# Patient Record
Sex: Female | Born: 1980 | Race: White | Hispanic: No | Marital: Married | Smoking: Never smoker
Health system: Southern US, Community
[De-identification: ages and names within clinical notes are randomized; demographics above are authoritative.]

## PROBLEM LIST (undated history)

## (undated) DIAGNOSIS — J45909 Unspecified asthma, uncomplicated: Secondary | ICD-10-CM

---

## 2011-10-28 ENCOUNTER — Emergency Department (HOSPITAL_COMMUNITY): Payer: PRIVATE HEALTH INSURANCE

## 2011-10-28 ENCOUNTER — Encounter (HOSPITAL_COMMUNITY): Payer: Self-pay | Admitting: *Deleted

## 2011-10-28 ENCOUNTER — Emergency Department (HOSPITAL_COMMUNITY)
Admission: EM | Admit: 2011-10-28 | Discharge: 2011-10-28 | Disposition: A | Discharge status: Home / Self Care | Payer: PRIVATE HEALTH INSURANCE | Attending: Emergency Medicine | Admitting: Emergency Medicine

## 2011-10-28 DIAGNOSIS — Z79899 Other long term (current) drug therapy: Secondary | ICD-10-CM | POA: Insufficient documentation

## 2011-10-28 DIAGNOSIS — R109 Unspecified abdominal pain: Secondary | ICD-10-CM | POA: Insufficient documentation

## 2011-10-28 DIAGNOSIS — R1031 Right lower quadrant pain: Secondary | ICD-10-CM | POA: Insufficient documentation

## 2011-10-28 DIAGNOSIS — N200 Calculus of kidney: Secondary | ICD-10-CM

## 2011-10-28 DIAGNOSIS — R112 Nausea with vomiting, unspecified: Secondary | ICD-10-CM | POA: Insufficient documentation

## 2011-10-28 DIAGNOSIS — J45909 Unspecified asthma, uncomplicated: Secondary | ICD-10-CM | POA: Insufficient documentation

## 2011-10-28 HISTORY — DX: Unspecified asthma, uncomplicated: J45.909

## 2011-10-28 LAB — URINALYSIS, ROUTINE W REFLEX MICROSCOPIC
Glucose, UA: NEGATIVE mg/dL
Leukocytes, UA: NEGATIVE
Nitrite: NEGATIVE
Urobilinogen, UA: 0.2 mg/dL (ref 0.0–1.0)

## 2011-10-28 LAB — PREGNANCY, URINE: Preg Test, Ur: NEGATIVE

## 2011-10-28 LAB — URINE MICROSCOPIC-ADD ON

## 2011-10-28 MED ORDER — ONDANSETRON HCL 4 MG/2ML IJ SOLN
4.0000 mg | Freq: Once | INTRAMUSCULAR | Status: AC
  Administered 2011-10-28: 4 mg via INTRAVENOUS
  Filled 2011-10-28: qty 2

## 2011-10-28 MED ORDER — OXYCODONE-ACETAMINOPHEN 5-325 MG PO TABS
1.0000 | ORAL_TABLET | Freq: Once | ORAL | Status: AC
  Administered 2011-10-28: 23:00:00 via ORAL

## 2011-10-28 MED ORDER — OXYCODONE-ACETAMINOPHEN 5-325 MG PO TABS
ORAL_TABLET | ORAL | Status: AC
  Filled 2011-10-28: qty 1

## 2011-10-28 MED ORDER — SODIUM CHLORIDE 0.9 % IV SOLN
INTRAVENOUS | Status: DC
  Administered 2011-10-28: 20:00:00 via INTRAVENOUS

## 2011-10-28 MED ORDER — OXYCODONE-ACETAMINOPHEN 5-325 MG PO TABS: 1.0000 | ORAL_TABLET | Freq: Four times a day (QID) | ORAL | Status: AC | PRN

## 2011-10-28 MED ORDER — ONDANSETRON HCL 8 MG PO TABS: 8.0000 mg | ORAL_TABLET | ORAL | Status: AC | PRN

## 2011-10-28 MED ORDER — KETOROLAC TROMETHAMINE 30 MG/ML IJ SOLN
30.0000 mg | Freq: Once | INTRAMUSCULAR | Status: AC
  Administered 2011-10-28: 30 mg via INTRAVENOUS
  Filled 2011-10-28: qty 1

## 2011-10-28 MED ORDER — SODIUM CHLORIDE 0.9 % IV BOLUS (SEPSIS)
500.0000 mL | Freq: Once | INTRAVENOUS | Status: AC
  Administered 2011-10-28: 20:00:00 via INTRAVENOUS

## 2011-10-28 MED ORDER — TAMSULOSIN HCL 0.4 MG PO CAPS: 0.4000 mg | ORAL_CAPSULE | Freq: Every day | ORAL | Status: AC

## 2011-10-28 MED ORDER — KETOROLAC TROMETHAMINE 60 MG/2ML IM SOLN: 60.0000 mg | Freq: Once | INTRAMUSCULAR | Status: DC

## 2011-10-28 MED ORDER — HYDROMORPHONE HCL PF 1 MG/ML IJ SOLN
1.0000 mg | Freq: Once | INTRAMUSCULAR | Status: AC
  Administered 2011-10-28: 1 mg via INTRAVENOUS
  Filled 2011-10-28: qty 1

## 2011-10-28 NOTE — ED Provider Notes (Signed)
History     CSN: [id number]  Arrival date & time 10/28/11  1919   First MD Initiated Contact with Patient 10/28/11 1952      Chief Complaint  Patient presents with  . Flank Pain    (Consider location/radiation/quality/duration/timing/severity/associated sxs/prior treatment) HPI.........abrupt onset right flank pain several hours ago. No dysuria, hematuria, fever, chills.  Nothing makes pain better or worse. Radiation to right lower quadrant. It is severe and crampy in nature  Past Medical History  Diagnosis Date  . Asthma     History reviewed. No pertinent past surgical history.  History reviewed. No pertinent family history.  History  Substance Use Topics  . Smoking status: Never Smoker   . Smokeless tobacco: Not on file  . Alcohol Use: No    OB History    Grav Para Term Preterm Abortions TAB SAB Ect Mult Living                  Review of Systems  All other systems reviewed and are negative.    Allergies  Review of patient's allergies indicates no known allergies.  Home Medications   Current Outpatient Rx  Name Route Sig Dispense Refill  . ALBUTEROL SULFATE HFA 108 (90 BASE) MCG/ACT IN AERS Inhalation Inhale 2 puffs into the lungs every 6 (six) hours as needed.    Marland Kitchen FLUTICASONE-SALMETEROL 100-50 MCG/DOSE IN AEPB Inhalation Inhale 1 puff into the lungs every 12 (twelve) hours.    . ONDANSETRON HCL 8 MG PO TABS Oral Take 1 tablet (8 mg total) by mouth every 4 (four) hours as needed for nausea. 10 tablet 0  . OXYCODONE-ACETAMINOPHEN 5-325 MG PO TABS Oral Take 1-2 tablets by mouth every 6 (six) hours as needed for pain. 20 tablet 0  . TAMSULOSIN HCL 0.4 MG PO CAPS Oral Take 1 capsule (0.4 mg total) by mouth daily. 15 capsule 0    BP 146/95  Pulse 84  Temp 98.3 F (36.8 C) (Oral)  Resp 24  Ht 5' 9.5" (1.765 m)  Wt 235 lb (106.595 kg)  BMI 34.21 kg/m2  SpO2 99%  LMP 10/14/2011  Physical Exam  Nursing note and vitals reviewed. Constitutional: She is  oriented to person, place, and time. She appears well-developed and well-nourished.  HENT:  Head: Normocephalic and atraumatic.  Eyes: Conjunctivae and EOM are normal. Pupils are equal, round, and reactive to light.  Neck: Normal range of motion. Neck supple.  Cardiovascular: Normal rate and regular rhythm.   Pulmonary/Chest: Effort normal and breath sounds normal.  Abdominal: Soft. Bowel sounds are normal.  Genitourinary:       Right flank tenderness  Musculoskeletal: Normal range of motion.  Neurological: She is alert and oriented to person, place, and time.  Skin: Skin is warm and dry.  Psychiatric: She has a normal mood and affect.    ED Course  Procedures (including critical care time)  Labs Reviewed  URINALYSIS, ROUTINE W REFLEX MICROSCOPIC - Abnormal; Notable for the following:    Hgb urine dipstick LARGE (*)     All other components within normal limits  URINE MICROSCOPIC-ADD ON - Abnormal; Notable for the following:    Squamous Epithelial / LPF MANY (*)     Bacteria, UA FEW (*)     All other components within normal limits  PREGNANCY, URINE   Ct Abdomen Pelvis Wo Contrast  10/28/2011  *RADIOLOGY REPORT*  Clinical Data: Right flank pain.  Nausea and vomiting.  Hematuria.  CT ABDOMEN AND PELVIS WITHOUT  CONTRAST  Technique:  Multidetector CT imaging of the abdomen and pelvis was performed following the standard protocol without intravenous contrast.  Comparison: None.  Findings: The lung bases are clear.  There is a 4 mm stone in the distal right ureter at the ureterovesicle junction with proximal pyelocaliectasis and ureterectasis as well as periureteral and pararenal stranding. Changes are consistent with moderate degree of obstruction.  The left ureter and collecting system are decompressed and no left ureteral or bladder stones are visualized.  The unenhanced appearance of the liver, spleen, gallbladder, pancreas, adrenal glands, abdominal aorta, and retroperitoneal lymph  nodes is unremarkable.  The stomach, small bowel, and colon are decompressed.  Diffusely stool filled colon.  No free air or free fluid in the abdomen.  Pelvis:  The uterus and adnexal structures are not enlarged.  No bladder wall thickening.  No free or loculated pelvic fluid collections.  No significant pelvic lymphadenopathy.  No evidence of diverticulitis.  The appendix is normal.  Normal alignment of the lumbar vertebrae.  IMPRESSION: 4 mm stone in the distal right ureter with moderate proximal obstruction.  Original Report Authenticated By: Marlon Pel, M.D.     1. Right kidney stone       MDM  History and physical consistent with right ureterolithiasis.   Better after pain management. Diagnosis discussed with patient and husband. Discharged on Percocet, Zofran, Flomax        Donnetta Hutching, MD 10/28/11 [phone]

## 2011-10-28 NOTE — ED Notes (Signed)
Rt flank pain with n/v

## 2014-02-17 IMAGING — CT CT ABD-PELV W/O CM
2 of 4 series · 17 of 46 positions shown, 19 images · non-contrast
Comparison: None.

CLINICAL DATA: Right flank pain.  Nausea and vomiting.  Hematuria.

CT ABDOMEN AND PELVIS WITHOUT CONTRAST
TECHNIQUE: Multidetector CT imaging of the abdomen and pelvis was
performed following the standard protocol without intravenous
contrast.

[Series 2: standard/full over (age)lbs 5.0 · axial · 0.90mm/px · z∈[-532,-87]mm · 14 of 97 slices shown, 16 images]
[im 4/97  soft-tissue]
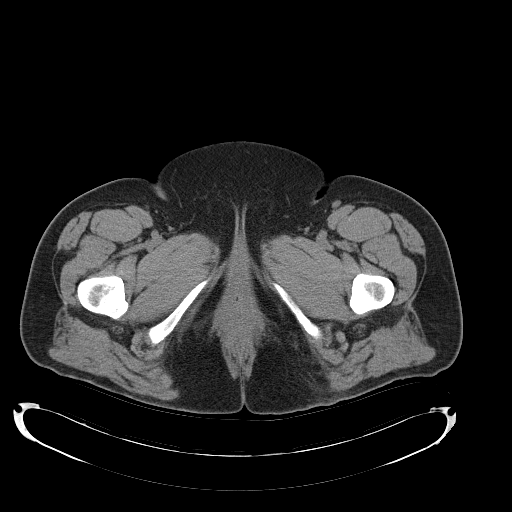
[im 4/97  bone]
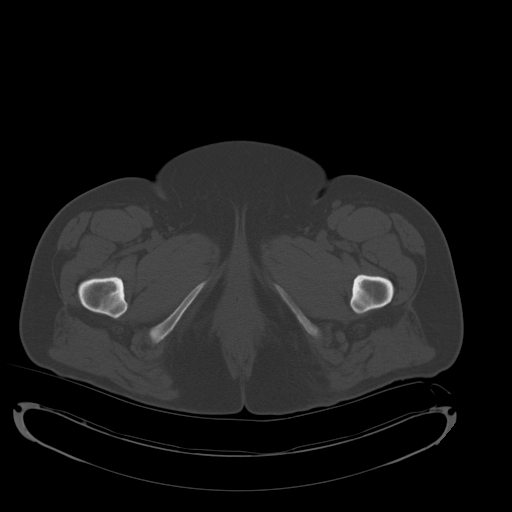
[im 12/97  soft-tissue]
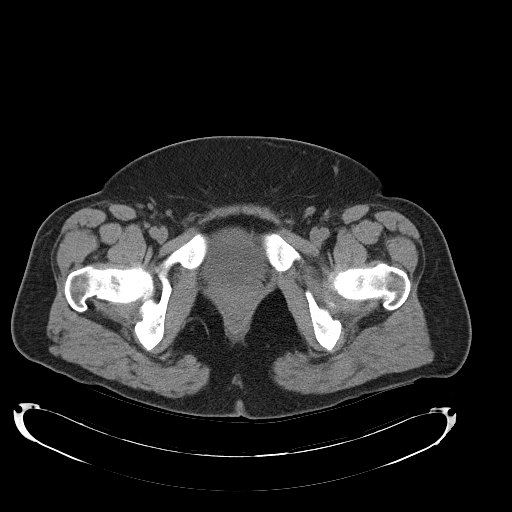
[im 19/97  soft-tissue]
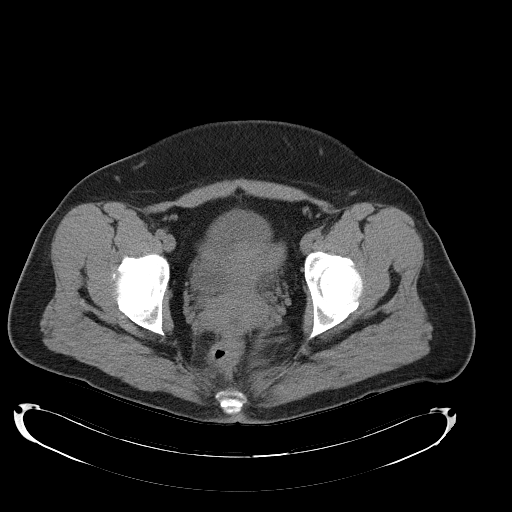
[im 26/97  soft-tissue]
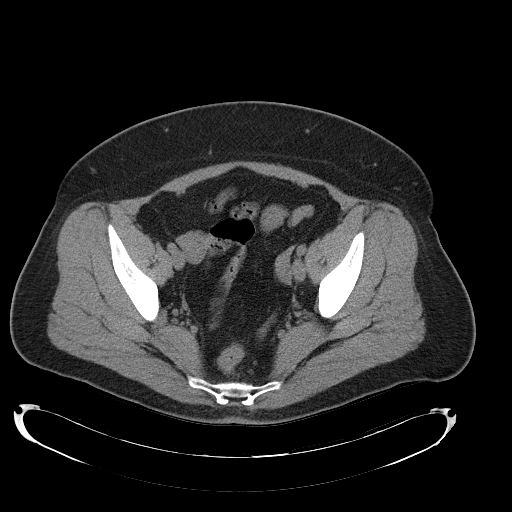
[im 34/97  soft-tissue]
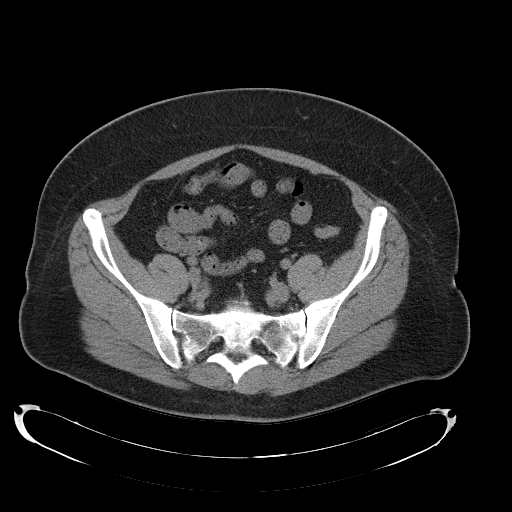
[im 37/97  soft-tissue]
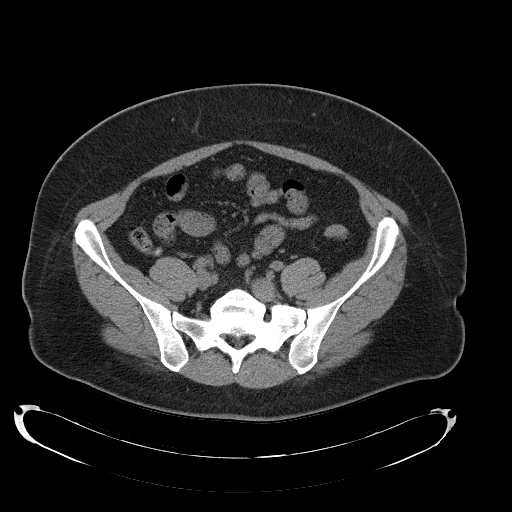
[im 45/97  soft-tissue]
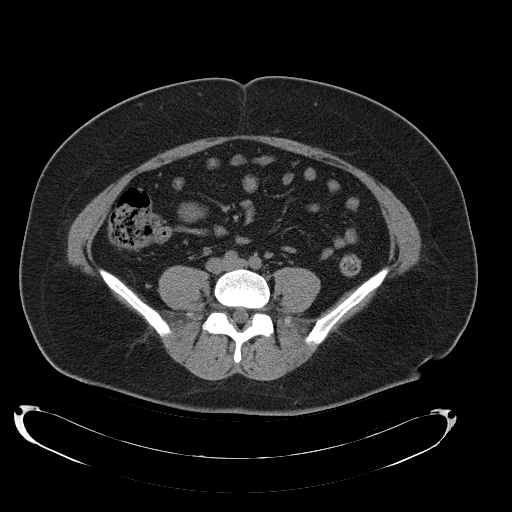
[im 52/97  soft-tissue]
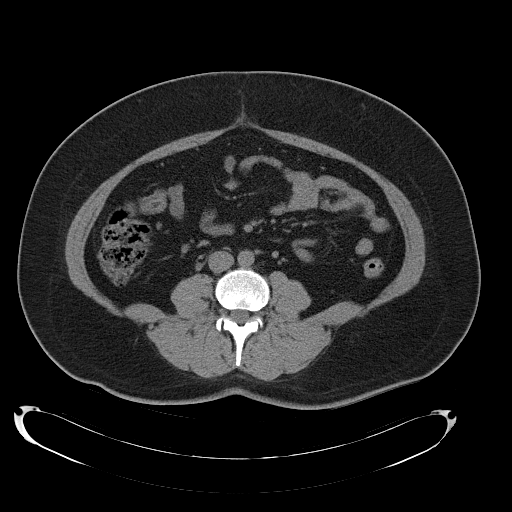
[im 60/97  soft-tissue]
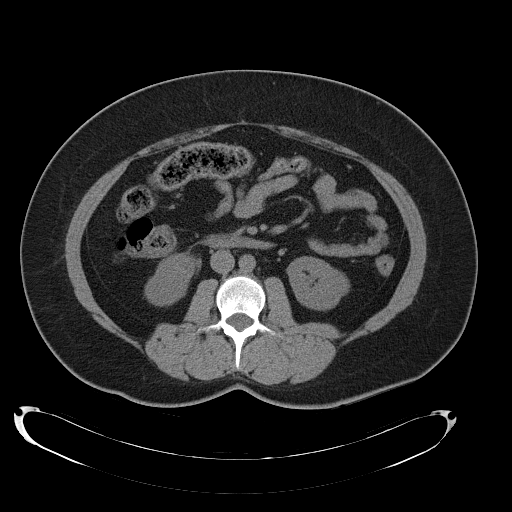
[im 60/97  bone]
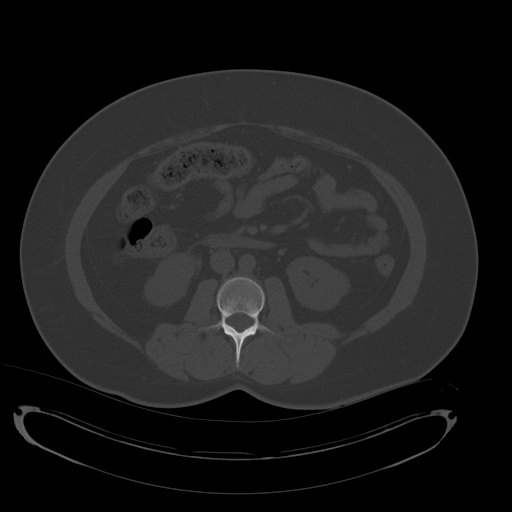
[im 63/97  soft-tissue]
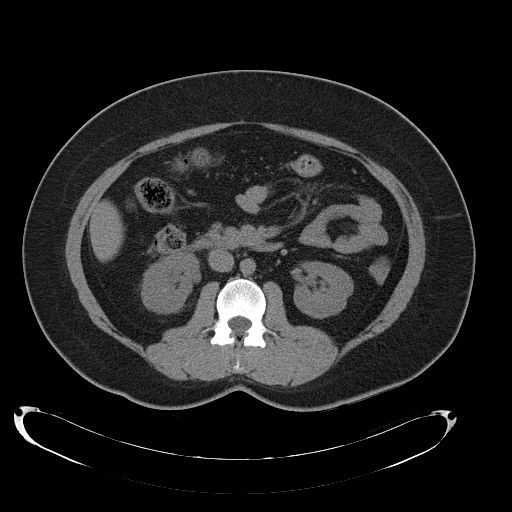
[im 71/97  soft-tissue]
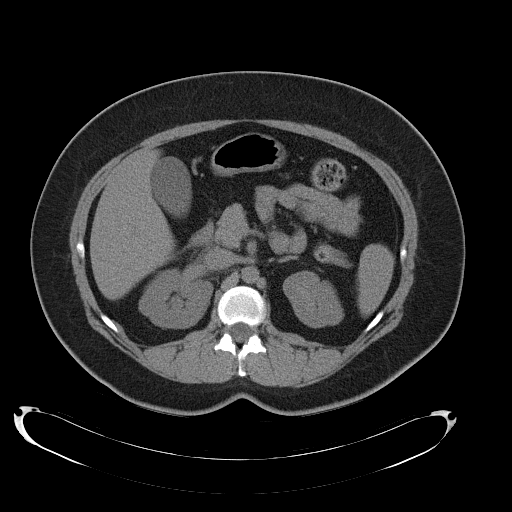
[im 78/97  soft-tissue]
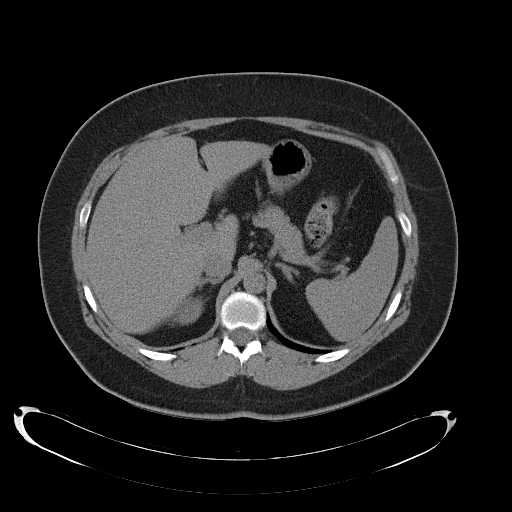
[im 85/97  soft-tissue]
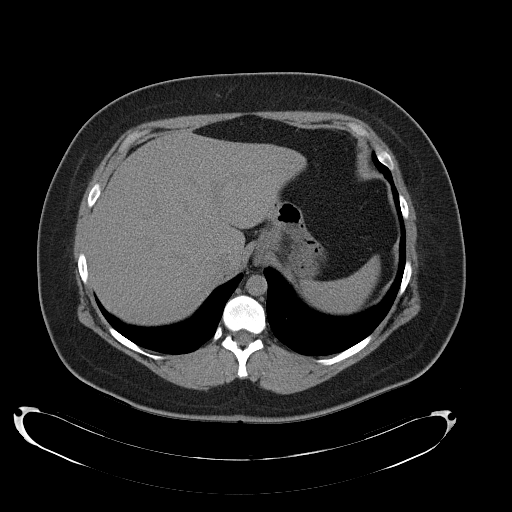
[im 93/97  soft-tissue]
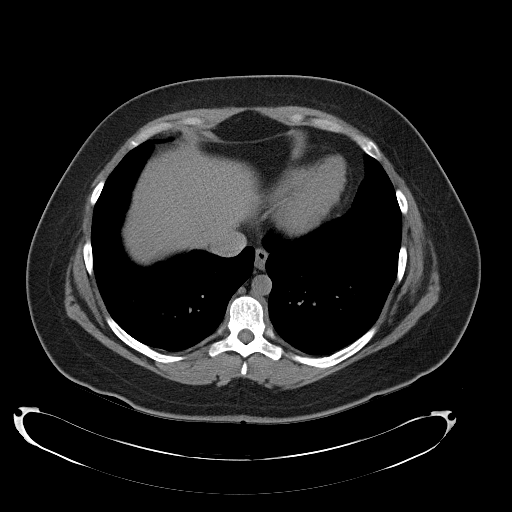

[Series 4: mpr coronal · coronal · 0.79mm/px · 3 of 100 slices shown]
[im 34/100  soft-tissue]
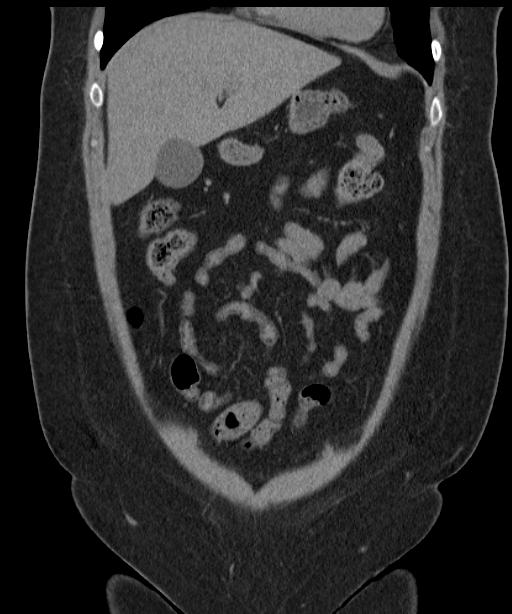
[im 45/100  soft-tissue]
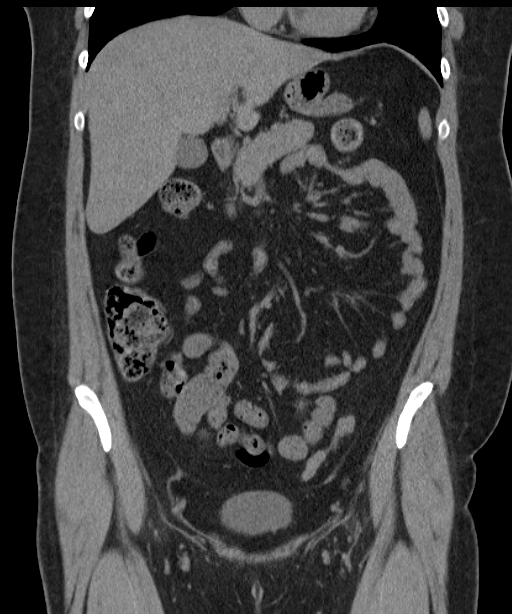
[im 56/100  soft-tissue]
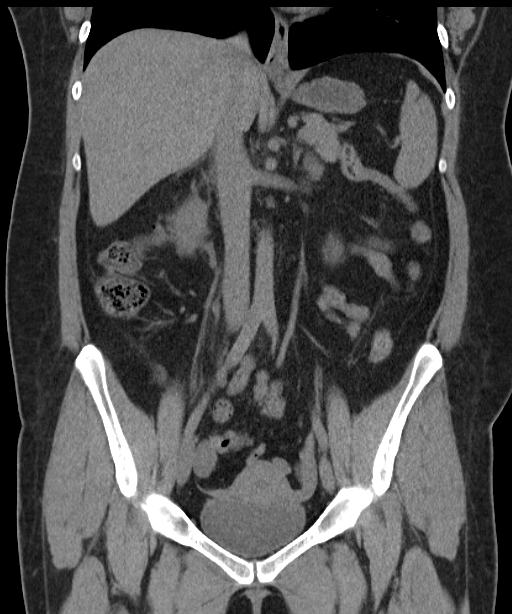

[17 of 46 positions shown; findings below may reference images not displayed]

FINDINGS: The lung bases are clear.

There is a 4 mm stone in the distal right ureter at the
ureterovesicle junction with proximal pyelocaliectasis and
ureterectasis as well as periureteral and pararenal stranding.
Changes are consistent with moderate degree of obstruction.  The
left ureter and collecting system are decompressed and no left
ureteral or bladder stones are visualized.

The unenhanced appearance of the liver, spleen, gallbladder,
pancreas, adrenal glands, abdominal aorta, and retroperitoneal
lymph nodes is unremarkable.  The stomach, small bowel, and colon
are decompressed.  Diffusely stool filled colon.  No free air or
free fluid in the abdomen.

Pelvis:  The uterus and adnexal structures are not enlarged.  No
bladder wall thickening.  No free or loculated pelvic fluid
collections.  No significant pelvic lymphadenopathy.  No evidence
of diverticulitis.  The appendix is normal.  Normal alignment of
the lumbar vertebrae.
IMPRESSION: 4 mm stone in the distal right ureter with moderate proximal
obstruction.

## 2022-04-08 ENCOUNTER — Ambulatory Visit: Payer: Commercial Managed Care - PPO | Admitting: Family Medicine

## 2022-04-08 ENCOUNTER — Other Ambulatory Visit (HOSPITAL_COMMUNITY): Payer: Self-pay | Admitting: Family Medicine

## 2022-04-08 ENCOUNTER — Encounter: Payer: Self-pay | Admitting: Family Medicine

## 2022-04-08 VITALS — BP 139/80 | HR 65 | Ht 70.0 in | Wt 239.0 lb

## 2022-04-08 DIAGNOSIS — Z1231 Encounter for screening mammogram for malignant neoplasm of breast: Secondary | ICD-10-CM | POA: Diagnosis not present

## 2022-04-08 DIAGNOSIS — Z114 Encounter for screening for human immunodeficiency virus [HIV]: Secondary | ICD-10-CM

## 2022-04-08 DIAGNOSIS — J45909 Unspecified asthma, uncomplicated: Secondary | ICD-10-CM | POA: Diagnosis not present

## 2022-04-08 DIAGNOSIS — Z1159 Encounter for screening for other viral diseases: Secondary | ICD-10-CM

## 2022-04-08 DIAGNOSIS — E559 Vitamin D deficiency, unspecified: Secondary | ICD-10-CM | POA: Diagnosis not present

## 2022-04-08 DIAGNOSIS — E785 Hyperlipidemia, unspecified: Secondary | ICD-10-CM

## 2022-04-08 DIAGNOSIS — R7301 Impaired fasting glucose: Secondary | ICD-10-CM

## 2022-04-08 DIAGNOSIS — E039 Hypothyroidism, unspecified: Secondary | ICD-10-CM

## 2022-04-08 DIAGNOSIS — R252 Cramp and spasm: Secondary | ICD-10-CM

## 2022-04-08 MED ORDER — BUDESONIDE-FORMOTEROL FUMARATE 80-4.5 MCG/ACT IN AERO: 2.0000 | INHALATION_SPRAY | Freq: Two times a day (BID) | RESPIRATORY_TRACT | 3 refills | Status: DC

## 2022-04-08 MED ORDER — CYCLOBENZAPRINE HCL 5 MG PO TABS: 5.0000 mg | ORAL_TABLET | Freq: Three times a day (TID) | ORAL | 1 refills | Status: AC | PRN

## 2022-04-08 MED ORDER — ALBUTEROL SULFATE HFA 108 (90 BASE) MCG/ACT IN AERS: 2.0000 | INHALATION_SPRAY | Freq: Four times a day (QID) | RESPIRATORY_TRACT | 2 refills | Status: DC | PRN

## 2022-04-08 MED ORDER — FLUTICASONE-SALMETEROL 100-50 MCG/ACT IN AEPB: 1.0000 | INHALATION_SPRAY | Freq: Two times a day (BID) | RESPIRATORY_TRACT | 3 refills | Status: AC

## 2022-04-08 NOTE — Progress Notes (Signed)
Acute Office Visit  Subjective:     Patient ID: Angelica Fitzpatrick, female    DOB: [birth year], 41 y.o.   MRN: [id number]  Chief Complaint  Patient presents with   Establish Care   Asthma    Patient takes simbicort and proair, feels like it is controlled on those medications.    Leg Pain    Patient complains of pain shooting up her R leg while she is at work, stands a lot for work. Started in February/March.   Angelica Fitzpatrick is in today for right thigh pain occurs when she standing for a long period of time or working extended hours at work. Her thigh pain started in February-March. Pain has gotten worse for the past two months. Patient would rate her pain 8/10, complains of a shooting and aching pain that does not radiate. Patient is able to perform and manage regular daily activities without limitations.   Refer to assessment plan for today visit.     Asthma Pertinent negatives include no fever or myalgias. Her past medical history is significant for asthma.  Leg Pain      Review of Systems  Constitutional:  Negative for chills and fever.  Musculoskeletal:  Negative for back pain, falls, joint pain, myalgias and neck pain.        Objective:    BP 139/80   Pulse 65   Ht 5\' 10"  (1.778 m)   Wt 239 lb (108.4 kg)   SpO2 96%   BMI 34.29 kg/m  BP Readings from Last 3 Encounters:  04/08/22 139/80  10/28/11 146/95      Physical Exam Cardiovascular:     Rate and Rhythm: Normal rate and regular rhythm.  Pulmonary:     Effort: Pulmonary effort is normal.     Breath sounds: Normal breath sounds.  Musculoskeletal:        General: Normal range of motion.  Neurological:     Mental Status: She is oriented to person, place, and time.  Psychiatric:        Mood and Affect: Mood normal.     No results found for any visits on 04/08/22.      Assessment & Plan:   Problem List Items Addressed This Visit       Other   Muscle cramps    Joint and Muscles are symmetrical  bilaterally. No swelling deformity, masses, or redness upon inspection. Non-tender palpation of joints without crepitus. Full Range of Motion of legs with smooth movement. Patient is able to maintain full resistance of muscle without tenderness or discomfort.  Her thigh pain is associated with long period of standing or long work hours. Prescribed Flexeril 5mg  for pain management       Relevant Medications   cyclobenzaprine (FLEXERIL) 5 MG tablet   Other Visit Diagnoses     Encounter for screening mammogram for malignant neoplasm of breast    -  Primary   Need for hepatitis C screening test       Vitamin D deficiency       Hypothyroidism, unspecified type       Hyperlipidemia, unspecified hyperlipidemia type       IFG (impaired fasting glucose)       Encounter for screening for HIV       Asthma, unspecified asthma severity, unspecified whether complicated, unspecified whether persistent       Relevant Medications   budesonide-formoterol (SYMBICORT) 80-4.5 MCG/ACT inhaler   albuterol (VENTOLIN HFA) 108 (90 Base) MCG/ACT inhaler  albuterol (VENTOLIN HFA) 108 (90 Base) MCG/ACT inhaler   fluticasone-salmeterol (ADVAIR) 100-50 MCG/ACT AEPB   budesonide-formoterol (SYMBICORT) 80-4.5 MCG/ACT inhaler       Meds ordered this encounter  Medications   cyclobenzaprine (FLEXERIL) 5 MG tablet    Sig: Take 1 tablet (5 mg total) by mouth 3 (three) times daily as needed for muscle spasms.    Dispense:  30 tablet    Refill:  1   albuterol (VENTOLIN HFA) 108 (90 Base) MCG/ACT inhaler    Sig: Inhale 2 puffs into the lungs every 6 (six) hours as needed for wheezing or shortness of breath.    Dispense:  8 g    Refill:  2   fluticasone-salmeterol (ADVAIR) 100-50 MCG/ACT AEPB    Sig: Inhale 1 puff into the lungs 2 (two) times daily.    Dispense:  1 each    Refill:  3   budesonide-formoterol (SYMBICORT) 80-4.5 MCG/ACT inhaler    Sig: Inhale 2 puffs into the lungs 2 (two) times daily.     Dispense:  1 each    Refill:  3    No follow-ups on file.  Cruzita Lederer Newman Nip, FNP

## 2022-04-08 NOTE — Assessment & Plan Note (Addendum)
Joint and Muscles are symmetrical bilaterally. No swelling deformity, masses, or redness upon inspection. Non-tender palpation of joints without crepitus. Full Range of Motion of legs with smooth movement. Patient is able to maintain full resistance of muscle without tenderness or discomfort.  Her thigh pain is associated with long period of standing or long work hours. Prescribed Flexeril 5mg  for pain management

## 2022-04-08 NOTE — Patient Instructions (Addendum)
Please follow up in 3 weeks for a Annual Physical Exam and leg pain management.  Happy New Year!

## 2022-04-19 ENCOUNTER — Encounter (HOSPITAL_COMMUNITY): Payer: Self-pay

## 2022-04-19 ENCOUNTER — Ambulatory Visit (HOSPITAL_COMMUNITY)
Admission: RE | Admit: 2022-04-19 | Discharge: 2022-04-19 | Disposition: A | Discharge status: Home / Self Care | Payer: Commercial Managed Care - PPO | Source: Ambulatory Visit | Attending: Family Medicine | Admitting: Family Medicine

## 2022-04-19 DIAGNOSIS — Z1231 Encounter for screening mammogram for malignant neoplasm of breast: Secondary | ICD-10-CM | POA: Insufficient documentation

## 2022-04-21 ENCOUNTER — Encounter (HOSPITAL_COMMUNITY): Payer: Self-pay | Admitting: Internal Medicine

## 2022-04-21 ENCOUNTER — Other Ambulatory Visit (HOSPITAL_COMMUNITY): Payer: Self-pay | Admitting: Family Medicine

## 2022-04-21 DIAGNOSIS — R928 Other abnormal and inconclusive findings on diagnostic imaging of breast: Secondary | ICD-10-CM

## 2022-05-10 ENCOUNTER — Ambulatory Visit (INDEPENDENT_AMBULATORY_CARE_PROVIDER_SITE_OTHER): Payer: Commercial Managed Care - PPO | Admitting: Family Medicine

## 2022-05-10 ENCOUNTER — Other Ambulatory Visit (HOSPITAL_COMMUNITY)
Admission: RE | Admit: 2022-05-10 | Discharge: 2022-05-10 | Disposition: A | Discharge status: Home / Self Care | Payer: Commercial Managed Care - PPO | Source: Ambulatory Visit | Attending: Family Medicine | Admitting: Family Medicine

## 2022-05-10 ENCOUNTER — Encounter: Payer: Self-pay | Admitting: Family Medicine

## 2022-05-10 VITALS — BP 136/82 | HR 82 | Ht 69.0 in | Wt 236.0 lb

## 2022-05-10 DIAGNOSIS — Z124 Encounter for screening for malignant neoplasm of cervix: Secondary | ICD-10-CM

## 2022-05-10 DIAGNOSIS — Z01411 Encounter for gynecological examination (general) (routine) with abnormal findings: Secondary | ICD-10-CM | POA: Diagnosis not present

## 2022-05-10 DIAGNOSIS — Z114 Encounter for screening for human immunodeficiency virus [HIV]: Secondary | ICD-10-CM

## 2022-05-10 DIAGNOSIS — Z0001 Encounter for general adult medical examination with abnormal findings: Secondary | ICD-10-CM | POA: Diagnosis not present

## 2022-05-10 DIAGNOSIS — I10 Essential (primary) hypertension: Secondary | ICD-10-CM

## 2022-05-10 DIAGNOSIS — Z01419 Encounter for gynecological examination (general) (routine) without abnormal findings: Secondary | ICD-10-CM

## 2022-05-10 DIAGNOSIS — E039 Hypothyroidism, unspecified: Secondary | ICD-10-CM

## 2022-05-10 DIAGNOSIS — Z1159 Encounter for screening for other viral diseases: Secondary | ICD-10-CM

## 2022-05-10 DIAGNOSIS — E785 Hyperlipidemia, unspecified: Secondary | ICD-10-CM

## 2022-05-10 DIAGNOSIS — Z309 Encounter for contraceptive management, unspecified: Secondary | ICD-10-CM

## 2022-05-10 DIAGNOSIS — R7301 Impaired fasting glucose: Secondary | ICD-10-CM

## 2022-05-10 NOTE — Assessment & Plan Note (Addendum)
Pap smear done, Patient tolerated procedure well. Explained to patient I will inform pap smear results

## 2022-05-10 NOTE — Assessment & Plan Note (Signed)
Physical exam done, labs ordered Patient BMI 34.85, I explained to start lifestyle modifications follow diet low in saturated fat, reduce dietary salt intake, avoid fatty foods, maintain an exercise routine 3 to 5 days a week for a minimum total of 150 minutes.

## 2022-05-10 NOTE — Assessment & Plan Note (Signed)
Physical exam done, pap smear completated

## 2022-05-10 NOTE — Patient Instructions (Signed)
It was a pleasure meeting with you today Please Follow up with any health concerns you may have

## 2022-05-10 NOTE — Progress Notes (Unsigned)
Complete physical exam  Patient: Angelica Fitzpatrick   DOB: 10/02/1980   42 y.o. Female  MRN: [id number]  Subjective:    Chief Complaint  Patient presents with   Annual Exam    Angelica Fitzpatrick is a 42 y.o. female who presents today for a complete physical exam. She reports consuming a general diet. The patient does not participate in regular exercise at present. She generally feels well. She reports sleeping well. She does not have additional problems to discuss today.    Most recent fall risk assessment:    04/08/2022    2:25 PM  [place] in the past year? 0  Number falls in past yr: 0  Injury with Fall? 0  Risk for fall due to : No Fall Risks  Follow up Falls evaluation completed     Most recent depression screenings:    04/08/2022    2:25 PM  PHQ 2/9 Scores  PHQ - 2 Score 0    Vision:Within last year  Patient Care Team: Del Ria Comment, Lamar Benes, FNP as PCP - General (Family Medicine)   Outpatient Medications Prior to Visit  Medication Sig   albuterol (VENTOLIN HFA) 108 (90 Base) MCG/ACT inhaler Inhale 2 puffs into the lungs every 6 (six) hours as needed for wheezing or shortness of breath.   budesonide-formoterol (SYMBICORT) 80-4.5 MCG/ACT inhaler Inhale 2 puffs into the lungs 2 (two) times daily.   budesonide-formoterol (SYMBICORT) 80-4.5 MCG/ACT inhaler Inhale 2 puffs into the lungs 2 (two) times daily.   cyclobenzaprine (FLEXERIL) 5 MG tablet Take 1 tablet (5 mg total) by mouth 3 (three) times daily as needed for muscle spasms.   albuterol (PROVENTIL HFA;VENTOLIN HFA) 108 (90 BASE) MCG/ACT inhaler Inhale 2 puffs into the lungs every 6 (six) hours as needed.   albuterol (VENTOLIN HFA) 108 (90 Base) MCG/ACT inhaler Inhale into the lungs every 6 (six) hours as needed for wheezing or shortness of breath.   fluticasone-salmeterol (ADVAIR) 100-50 MCG/ACT AEPB Inhale 1 puff into the lungs 2 (two) times daily. (Patient not taking: Reported on 05/10/2022)    Fluticasone-Salmeterol (ADVAIR) 100-50 MCG/DOSE AEPB Inhale 1 puff into the lungs every 12 (twelve) hours.   Tamsulosin HCl (FLOMAX) 0.4 MG CAPS Take 1 capsule (0.4 mg total) by mouth daily.   No facility-administered medications prior to visit.    Review of Systems  Constitutional:  Negative for chills and fever.  HENT:  Negative for ear pain.   Eyes:  Negative for blurred vision.  Respiratory:  Negative for shortness of breath.   Cardiovascular:  Negative for chest pain.  Gastrointestinal:  Negative for abdominal pain.  Genitourinary:  Negative for dysuria.  Musculoskeletal:  Negative for myalgias.  Skin:  Negative for rash.  Neurological:  Negative for headaches.  Psychiatric/Behavioral:  The patient is not nervous/anxious.        Objective:    BP 136/82   Pulse 82   Ht 5\' 9"  (1.753 m)   Wt 236 lb (107 kg)   SpO2 98%   BMI 34.85 kg/m  BP Readings from Last 3 Encounters:  05/10/22 136/82  04/08/22 139/80  10/28/11 146/95      Physical Exam Vitals reviewed.  Constitutional:      General: She is not in acute distress. HENT:     Head: Normocephalic and atraumatic.     Right Ear: Tympanic membrane normal.     Left Ear: Tympanic membrane normal.     Nose: Nose normal.  Mouth/Throat:     Mouth: Mucous membranes are moist.  Eyes:     Extraocular Movements: Extraocular movements intact.     Pupils: Pupils are equal, round, and reactive to light.  Cardiovascular:     Rate and Rhythm: Normal rate and regular rhythm.     Pulses: Normal pulses.     Heart sounds: No murmur heard. Pulmonary:     Effort: Pulmonary effort is normal.     Breath sounds: Normal breath sounds.  Abdominal:     General: Bowel sounds are normal.     Palpations: Abdomen is soft.  Musculoskeletal:        General: No swelling or tenderness. Normal range of motion.     Cervical back: Normal range of motion and neck supple.  Skin:    General: Skin is warm and dry.     Capillary Refill:  Capillary refill takes less than 2 seconds.  Neurological:     General: No focal deficit present.     Mental Status: She is alert.     Coordination: Coordination normal.     Gait: Gait normal.  Psychiatric:        Mood and Affect: Mood normal.      No results found for any visits on 05/10/22.    Assessment & Plan:    Routine Health Maintenance and Physical Exam   There is no immunization history on file for this patient.  Health Maintenance  Topic Date Due   Hepatitis C Screening  Never done   DTaP/Tdap/Td (1 - Tdap) Never done   PAP SMEAR-Modifier  Never done   COVID-19 Vaccine (1) 05/26/2022 (Originally 07/30/1981)   INFLUENZA VACCINE  07/11/2022 (Originally 11/10/2021)   HIV Screening  Completed   HPV VACCINES  Aged Out    Discussed health benefits of physical activity, and encouraged her to engage in regular exercise appropriate for her age and condition.  Pap smear for cervical cancer screening -     Cytology - PAP  Screening for HIV (human immunodeficiency virus) -     HIV Antibody (routine testing w rflx)  Hypothyroidism, unspecified type -     TSH + free T4  Need for hepatitis C screening test -     Hepatitis C antibody  IFG (impaired fasting glucose) -     Hemoglobin A1c  Hypertension, unspecified type -     CMP14+EGFR -     Microalbumin / creatinine urine ratio  Hyperlipidemia, unspecified hyperlipidemia type -     CBC with Differential/Platelet -     Lipid panel  Encounter for Papanicolaou smear for cervical cancer screening Assessment & Plan: Pap smear done, Patient tolerated procedure well. Explained to patient I will inform pap smear results    Encounter for routine adult physical exam with abnormal findings  Encounter for physical examination, contraception, and Papanicolaou smear of cervix Assessment & Plan: Physical exam done, pap smear completated     No follow-ups on file.     Renard Hamper Ria Comment, FNP

## 2022-05-11 ENCOUNTER — Ambulatory Visit (HOSPITAL_COMMUNITY)
Admission: RE | Admit: 2022-05-11 | Discharge: 2022-05-11 | Disposition: A | Discharge status: Home / Self Care | Payer: Commercial Managed Care - PPO | Source: Ambulatory Visit | Attending: Family Medicine | Admitting: Family Medicine

## 2022-05-11 DIAGNOSIS — R928 Other abnormal and inconclusive findings on diagnostic imaging of breast: Secondary | ICD-10-CM | POA: Insufficient documentation

## 2022-05-12 LAB — HEPATITIS C ANTIBODY: Hep C Virus Ab: NONREACTIVE

## 2022-05-12 LAB — CMP14+EGFR
ALT: 24 IU/L (ref 0–32)
AST: 18 IU/L (ref 0–40)
Albumin: 4.9 g/dL (ref 3.9–4.9)
Alkaline Phosphatase: 83 IU/L (ref 44–121)
BUN/Creatinine Ratio: 15 (ref 9–23)
CO2: 20 mmol/L (ref 20–29)
Calcium: 10.1 mg/dL (ref 8.7–10.2)
Chloride: 100 mmol/L (ref 96–106)
Potassium: 4.3 mmol/L (ref 3.5–5.2)
Sodium: 138 mmol/L (ref 134–144)

## 2022-05-12 LAB — CBC WITH DIFFERENTIAL/PLATELET
Basos: 1 %
EOS (ABSOLUTE): 0.1 10*3/uL (ref 0.0–0.4)
Immature Grans (Abs): 0 10*3/uL (ref 0.0–0.1)
MCHC: 33.5 g/dL (ref 31.5–35.7)
MCV: 86 fL (ref 79–97)
Monocytes: 6 %
Neutrophils Absolute: 6 10*3/uL (ref 1.4–7.0)
Platelets: 306 10*3/uL (ref 150–450)

## 2022-05-12 LAB — TSH+FREE T4
Free T4: 0.98 ng/dL (ref 0.82–1.77)
TSH: 2.53 u[IU]/mL (ref 0.450–4.500)

## 2022-05-12 LAB — LIPID PANEL
Chol/HDL Ratio: 4.5 ratio — ABNORMAL HIGH (ref 0.0–4.4)
Cholesterol, Total: 191 mg/dL (ref 100–199)
LDL Chol Calc (NIH): 117 mg/dL — ABNORMAL HIGH (ref 0–99)
Triglycerides: 181 mg/dL — ABNORMAL HIGH (ref 0–149)
VLDL Cholesterol Cal: 32 mg/dL (ref 5–40)

## 2022-05-12 LAB — MICROALBUMIN / CREATININE URINE RATIO

## 2022-05-12 LAB — HIV ANTIBODY (ROUTINE TESTING W REFLEX): HIV Screen 4th Generation wRfx: NONREACTIVE

## 2022-05-14 LAB — CMP14+EGFR
Albumin/Globulin Ratio: 2 (ref 1.2–2.2)
BUN: 15 mg/dL (ref 6–24)
Bilirubin Total: 0.5 mg/dL (ref 0.0–1.2)
Creatinine, Ser: 1.02 mg/dL — ABNORMAL HIGH (ref 0.57–1.00)
Globulin, Total: 2.4 g/dL (ref 1.5–4.5)
Glucose: 73 mg/dL (ref 70–99)
Total Protein: 7.3 g/dL (ref 6.0–8.5)
eGFR: 70 mL/min/{1.73_m2} (ref >59)

## 2022-05-14 LAB — CBC WITH DIFFERENTIAL/PLATELET
Basophils Absolute: 0.1 10*3/uL (ref 0.0–0.2)
Eos: 1 %
Hematocrit: 45.7 % (ref 34.0–46.6)
Hemoglobin: 15.3 g/dL (ref 11.1–15.9)
Immature Granulocytes: 0 %
Lymphocytes Absolute: 3.9 10*3/uL — ABNORMAL HIGH (ref 0.7–3.1)
Lymphs: 36 %
MCH: 28.7 pg (ref 26.6–33.0)
Monocytes Absolute: 0.6 10*3/uL (ref 0.1–0.9)
Neutrophils: 56 %
RBC: 5.33 x10E6/uL — ABNORMAL HIGH (ref 3.77–5.28)
RDW: 12.6 % (ref 11.7–15.4)
WBC: 10.7 10*3/uL (ref 3.4–10.8)

## 2022-05-14 LAB — HEMOGLOBIN A1C
Est. average glucose Bld gHb Est-mCnc: 117 mg/dL
Hgb A1c MFr Bld: 5.7 % — ABNORMAL HIGH (ref 4.8–5.6)

## 2022-05-14 LAB — CYTOLOGY - PAP: Diagnosis: NEGATIVE

## 2022-05-14 LAB — LIPID PANEL: HDL: 42 mg/dL (ref >39)

## 2022-05-14 LAB — MICROALBUMIN / CREATININE URINE RATIO: Creatinine, Urine: 99.6 mg/dL

## 2022-06-17 ENCOUNTER — Other Ambulatory Visit: Payer: Self-pay

## 2022-06-17 ENCOUNTER — Telehealth: Payer: Self-pay | Admitting: Family Medicine

## 2022-06-17 DIAGNOSIS — J45909 Unspecified asthma, uncomplicated: Secondary | ICD-10-CM

## 2022-06-17 MED ORDER — ALBUTEROL SULFATE HFA 108 (90 BASE) MCG/ACT IN AERS: 2.0000 | INHALATION_SPRAY | Freq: Four times a day (QID) | RESPIRATORY_TRACT | 2 refills | Status: DC | PRN

## 2022-06-17 MED ORDER — BUDESONIDE-FORMOTEROL FUMARATE 80-4.5 MCG/ACT IN AERO: 2.0000 | INHALATION_SPRAY | Freq: Two times a day (BID) | RESPIRATORY_TRACT | 3 refills | Status: DC

## 2022-06-17 NOTE — Telephone Encounter (Signed)
Refill sent.

## 2022-06-17 NOTE — Telephone Encounter (Signed)
Patient needs refills on   budesonide-formoterol (SYMBICORT) 80-4.5 MCG/ACT inhaler  PROAIR HSA inhaler  Sent in to [facility] in [place].    Also wants back up pharm changed to [facility] instead of [facility]

## 2022-07-20 ENCOUNTER — Other Ambulatory Visit: Payer: Self-pay

## 2022-07-20 DIAGNOSIS — J45909 Unspecified asthma, uncomplicated: Secondary | ICD-10-CM

## 2022-07-20 MED ORDER — BUDESONIDE-FORMOTEROL FUMARATE 80-4.5 MCG/ACT IN AERO: 2.0000 | INHALATION_SPRAY | Freq: Two times a day (BID) | RESPIRATORY_TRACT | 6 refills | Status: DC

## 2022-07-20 MED ORDER — ALBUTEROL SULFATE HFA 108 (90 BASE) MCG/ACT IN AERS: 2.0000 | INHALATION_SPRAY | Freq: Four times a day (QID) | RESPIRATORY_TRACT | 10 refills | Status: DC | PRN

## 2022-08-03 ENCOUNTER — Other Ambulatory Visit: Payer: Self-pay

## 2022-08-03 ENCOUNTER — Telehealth: Payer: Self-pay | Admitting: Family Medicine

## 2022-08-03 DIAGNOSIS — J45909 Unspecified asthma, uncomplicated: Secondary | ICD-10-CM

## 2022-08-03 MED ORDER — ALBUTEROL SULFATE HFA 108 (90 BASE) MCG/ACT IN AERS: 2.0000 | INHALATION_SPRAY | Freq: Four times a day (QID) | RESPIRATORY_TRACT | 10 refills | Status: DC | PRN

## 2022-08-03 MED ORDER — BUDESONIDE-FORMOTEROL FUMARATE 80-4.5 MCG/ACT IN AERO: 2.0000 | INHALATION_SPRAY | Freq: Two times a day (BID) | RESPIRATORY_TRACT | 6 refills | Status: AC

## 2022-08-03 NOTE — Telephone Encounter (Signed)
Refills sent to pharmacy, patient advised. 

## 2022-08-03 NOTE — Telephone Encounter (Signed)
Prescription Request  08/03/2022  LOV: 05/10/2022  What is the name of the medication or equipment? albuterol (VENTOLIN HFA) 108 (90 Base) MCG/ACT inhaler   budesonide-formoterol (SYMBICORT) 80-4.5 MCG/ACT inhaler   Have you contacted your pharmacy to request a refill? Yes   Which pharmacy would you like this sent to?    [facility] - [place], [place] - [address] [place] [address] Phone: [phone] Fax: [phone]  Patient notified that their request is being sent to the clinical staff for review and that they should receive a response within 2 business days.   Please advise at Mobile [phone] (mobile)    Pharm sent out email  to patient that pharm has not received refill request.

## 2022-08-18 ENCOUNTER — Telehealth: Payer: Self-pay | Admitting: Family Medicine

## 2022-08-18 NOTE — Telephone Encounter (Signed)
Patient  called having trouble getting medicine,   The Advair is not covered but the Rennis Golden is covered by patient insurance   Please contact patient patient she is not sure which one she needs says she has not yet received no medication since her ne patient visit with [place] Call back patient # [phone]

## 2022-08-19 MED ORDER — ALBUTEROL SULFATE HFA 108 (90 BASE) MCG/ACT IN AERS: 2.0000 | INHALATION_SPRAY | Freq: Four times a day (QID) | RESPIRATORY_TRACT | 6 refills | Status: DC | PRN

## 2022-08-19 NOTE — Telephone Encounter (Signed)
Sent!

## 2022-09-01 ENCOUNTER — Telehealth: Payer: Self-pay | Admitting: Family Medicine

## 2022-09-01 ENCOUNTER — Other Ambulatory Visit: Payer: Self-pay

## 2022-09-01 DIAGNOSIS — J45909 Unspecified asthma, uncomplicated: Secondary | ICD-10-CM

## 2022-09-01 MED ORDER — ALBUTEROL SULFATE HFA 108 (90 BASE) MCG/ACT IN AERS: 2.0000 | INHALATION_SPRAY | Freq: Four times a day (QID) | RESPIRATORY_TRACT | 10 refills | Status: DC | PRN

## 2022-09-01 NOTE — Telephone Encounter (Signed)
Patient called said has not yet received this medicine since January 2024  ProAir (ProAir not covered by patient insurance) Can an alternative be called into her pharmacy:   Pharmacy  [facility] - [place], [place] - [address] Renard Hamper [place] [address] Phone: [phone]  Fax: [phone]

## 2022-09-01 NOTE — Telephone Encounter (Signed)
Spoke with patient. This was refilled three times in the last month.

## 2022-09-14 ENCOUNTER — Telehealth: Payer: Self-pay | Admitting: Family Medicine

## 2022-09-14 MED ORDER — ALBUTEROL SULFATE HFA 108 (90 BASE) MCG/ACT IN AERS: 2.0000 | INHALATION_SPRAY | Freq: Four times a day (QID) | RESPIRATORY_TRACT | 6 refills | Status: AC | PRN

## 2022-09-14 NOTE — Telephone Encounter (Signed)
Patient calling said received email yesterday from [employer] and it was denied and canceled. Albuterol and pro air not covered by her insurance, what other alternative medicine can replace that will be covered by patient insurance.   Patient asked for a call back on whatever was changed. [phone].

## 2022-09-14 NOTE — Telephone Encounter (Signed)
Cala Bradford with [employer] called in on patient behalf. Not approved  albuterol (VENTOLIN HFA) 108 (90 Base) MCG/ACT inhaler     Approved  albuterol (PROVENTIL HFA;VENTOLIN HFA) 108 (90 BASE) MCG/ACT inhaler    Call back # [phone] Reference # [id number]

## 2022-09-14 NOTE — Telephone Encounter (Signed)
I am not sure what other alternative

## 2022-09-22 ENCOUNTER — Other Ambulatory Visit: Payer: Self-pay | Admitting: Family Medicine

## 2023-05-13 ENCOUNTER — Other Ambulatory Visit (HOSPITAL_COMMUNITY): Payer: Self-pay | Admitting: Family Medicine

## 2023-05-13 DIAGNOSIS — Z1231 Encounter for screening mammogram for malignant neoplasm of breast: Secondary | ICD-10-CM

## 2023-05-16 ENCOUNTER — Ambulatory Visit (HOSPITAL_COMMUNITY)
Admission: RE | Admit: 2023-05-16 | Discharge: 2023-05-16 | Disposition: A | Discharge status: Home / Self Care | Payer: Commercial Managed Care - PPO | Source: Ambulatory Visit | Attending: Family Medicine | Admitting: Family Medicine

## 2023-05-16 DIAGNOSIS — Z1231 Encounter for screening mammogram for malignant neoplasm of breast: Secondary | ICD-10-CM | POA: Diagnosis present

## 2023-11-21 ENCOUNTER — Telehealth: Payer: Self-pay | Admitting: Family Medicine

## 2023-11-21 NOTE — Telephone Encounter (Signed)
 LVM for pt to call back to reschedule physical with Dr Antonetta- will have to be scheduled with Hilario or another NP. Currently holding 8/20 at 9:00 for patient If she is able to take it.

## 2023-11-24 ENCOUNTER — Other Ambulatory Visit: Payer: Self-pay

## 2023-11-24 ENCOUNTER — Ambulatory Visit
Admission: RE | Admit: 2023-11-24 | Discharge: 2023-11-24 | Disposition: A | Discharge status: Home / Self Care | Payer: Self-pay | Source: Ambulatory Visit | Attending: Family Medicine | Admitting: Family Medicine

## 2023-11-24 VITALS — BP 145/95 | HR 69 | Temp 98.4°F | Resp 20

## 2023-11-24 DIAGNOSIS — N390 Urinary tract infection, site not specified: Secondary | ICD-10-CM

## 2023-11-24 LAB — POCT URINE DIPSTICK
Glucose, UA: NEGATIVE mg/dL
Ketones, POC UA: NEGATIVE mg/dL
Nitrite, UA: POSITIVE — AB
POC PROTEIN,UA: >=300 — AB
Spec Grav, UA: 1.025 (ref 1.010–1.025)
Urobilinogen, UA: 1 U/dL
pH, UA: 5.5 (ref 5.0–8.0)

## 2023-11-24 MED ORDER — CEPHALEXIN 500 MG PO CAPS: 500.0000 mg | ORAL_CAPSULE | Freq: Two times a day (BID) | ORAL | 0 refills | Status: AC

## 2023-11-24 NOTE — ED Triage Notes (Signed)
 Pt reports urinary pressure, hematuria for last several days. Denies any fevers, abd pain. Reports hx of kidney stone.

## 2023-11-24 NOTE — ED Provider Notes (Signed)
 [facility]    CSN: [id number] Arrival date & time: 11/24/23  1727      History   Chief Complaint Chief Complaint  Patient presents with   Urinary Frequency    UTI - Entered by patient    HPI Angelica Fitzpatrick is a 43 y.o. female.   Patient presenting today with several day history of urinary pressure, hematuria.  Denies fever, chills, abdominal pain, flank pain, nausea, vomiting.  So far not trying anything over-the-counter for symptoms.  History of kidney stone.    Past Medical History:  Diagnosis Date   Asthma     Patient Active Problem List   Diagnosis Date Noted   Encounter for Papanicolaou smear for cervical cancer screening 05/10/2022   Encounter for routine adult physical exam with abnormal findings 05/10/2022   Muscle cramps 04/08/2022    History reviewed. No pertinent surgical history.  OB History   No obstetric history on file.      Home Medications    Prior to Admission medications   Medication Sig Start Date End Date Taking? Authorizing Provider  cephALEXin  (KEFLEX ) 500 MG capsule Take 1 capsule (500 mg total) by mouth 2 (two) times daily. 11/24/23  Yes Stuart Vernell Norris, PA-C  albuterol  (VENTOLIN  HFA) 108 (90 Base) MCG/ACT inhaler Inhale 2 puffs into the lungs every 6 (six) hours as needed. 09/14/22   Del Orbe Polanco, Iliana, FNP  budesonide -formoterol  (SYMBICORT ) 80-4.5 MCG/ACT inhaler Inhale 2 puffs into the lungs 2 (two) times daily.    [provider]  budesonide -formoterol  (SYMBICORT ) 80-4.5 MCG/ACT inhaler Inhale 2 puffs into the lungs 2 (two) times daily. 08/03/22   Del Wilhelmena Lloyd Sola, FNP  cyclobenzaprine  (FLEXERIL ) 5 MG tablet Take 1 tablet (5 mg total) by mouth 3 (three) times daily as needed for muscle spasms. 04/08/22   Del Orbe Polanco, Iliana, FNP  fluticasone -salmeterol (ADVAIR) 100-50 MCG/ACT AEPB Inhale 1 puff into the lungs 2 (two) times daily. Patient not taking: Reported on 05/10/2022 04/08/22   Del Orbe  Polanco, Iliana, FNP  Fluticasone -Salmeterol (ADVAIR) 100-50 MCG/DOSE AEPB Inhale 1 puff into the lungs every 12 (twelve) hours.    [provider]  Tamsulosin  HCl (FLOMAX ) 0.4 MG CAPS Take 1 capsule (0.4 mg total) by mouth daily. 10/28/11   Bluford Rogue, MD    Family History Family History  Problem Relation Age of Onset   Breast cancer Mother     Social History Social History   Tobacco Use   Smoking status: Never  Substance Use Topics   Alcohol use: No   Drug use: No     Allergies   Patient has no known allergies.   Review of Systems Review of Systems Per HPI  Physical Exam Triage Vital Signs ED Triage Vitals  Encounter Vitals Group     BP 11/24/23 1736 (!) 145/95     Girls Systolic BP Percentile --      Girls Diastolic BP Percentile --      Boys Systolic BP Percentile --      Boys Diastolic BP Percentile --      Pulse Rate 11/24/23 1736 69     Resp 11/24/23 1736 20     Temp 11/24/23 1736 98.4 F (36.9 C)     Temp Source 11/24/23 1736 Oral     SpO2 11/24/23 1736 96 %     Weight --      Height --      Head Circumference --      Peak  Flow --      Pain Score 11/24/23 1739 0     Pain Loc --      Pain Education --      Exclude from Growth Chart --    No data found.  Updated Vital Signs BP (!) 145/95 (BP Location: Right Arm)   Pulse 69   Temp 98.4 F (36.9 C) (Oral)   Resp 20   LMP 10/28/2023 (Approximate)   SpO2 96%   Visual Acuity Right Eye Distance:   Left Eye Distance:   Bilateral Distance:    Right Eye Near:   Left Eye Near:    Bilateral Near:     Physical Exam Vitals and nursing note reviewed.  Constitutional:      Appearance: Normal appearance. She is not ill-appearing.  HENT:     Head: Atraumatic.  Eyes:     Extraocular Movements: Extraocular movements intact.     Conjunctiva/sclera: Conjunctivae normal.  Cardiovascular:     Rate and Rhythm: Normal rate.  Pulmonary:     Effort: Pulmonary effort is normal.  Abdominal:      General: Bowel sounds are normal. There is no distension.     Palpations: Abdomen is soft.     Tenderness: There is no abdominal tenderness. There is no guarding.  Musculoskeletal:        General: Normal range of motion.     Cervical back: Normal range of motion and neck supple.  Skin:    General: Skin is warm and dry.  Neurological:     Mental Status: She is alert and oriented to person, place, and time.  Psychiatric:        Mood and Affect: Mood normal.        Thought Content: Thought content normal.        Judgment: Judgment normal.      UC Treatments / Results  Labs (all labs ordered are listed, but only abnormal results are displayed) Labs Reviewed  POCT URINE DIPSTICK - Abnormal; Notable for the following components:      Result Value   Color, UA red (*)    Bilirubin, UA small (*)    Blood, UA large (*)    POC PROTEIN,UA >=300 (*)    Nitrite, UA Positive (*)    Leukocytes, UA Trace (*)    All other components within normal limits  URINE CULTURE    EKG   Radiology No results found.  Procedures Procedures (including critical care time)  Medications Ordered in UC Medications - No data to display  Initial Impression / Assessment and Plan / UC Course  I have reviewed the triage vital signs and the nursing notes.  Pertinent labs & imaging results that were available during my care of the patient were reviewed by me and considered in my medical decision making (see chart for details).     Urinalysis today with evidence of urinary tract infection, treat with Keflex  and await culture, adjusting if needed.  Push fluids, supportive home care and return precautions reviewed.  Final Clinical Impressions(s) / UC Diagnoses   Final diagnoses:  Acute lower UTI   Discharge Instructions   None    ED Prescriptions     Medication Sig Dispense Auth. Provider   cephALEXin  (KEFLEX ) 500 MG capsule Take 1 capsule (500 mg total) by mouth 2 (two) times daily. 10 capsule  Stuart Vernell Norris, [place]      PDMP not reviewed this encounter.   Stuart Vernell Norris, [place] 11/24/23 1839

## 2023-11-25 ENCOUNTER — Encounter: Payer: Self-pay | Admitting: Family Medicine

## 2023-11-27 LAB — URINE CULTURE: Culture: >=100000 — AB

## 2023-11-28 ENCOUNTER — Ambulatory Visit (HOSPITAL_COMMUNITY): Payer: Self-pay

## 2024-04-20 ENCOUNTER — Other Ambulatory Visit (HOSPITAL_COMMUNITY): Payer: Self-pay | Admitting: Family Medicine

## 2024-04-20 DIAGNOSIS — Z1231 Encounter for screening mammogram for malignant neoplasm of breast: Secondary | ICD-10-CM

## 2024-05-16 ENCOUNTER — Ambulatory Visit (HOSPITAL_COMMUNITY)
Admission: RE | Admit: 2024-05-16 | Discharge: 2024-05-16 | Disposition: A | Source: Ambulatory Visit | Attending: Family Medicine

## 2024-05-16 DIAGNOSIS — Z1231 Encounter for screening mammogram for malignant neoplasm of breast: Secondary | ICD-10-CM
